# Patient Record
Sex: Female | Born: 2000 | Race: White | Hispanic: Yes | Marital: Single | Smoking: Never smoker
Health system: Southern US, Community
[De-identification: ages and names within clinical notes are randomized; demographics above are authoritative.]

## PROBLEM LIST (undated history)

## (undated) ENCOUNTER — Inpatient Hospital Stay (HOSPITAL_COMMUNITY): Payer: Self-pay

---
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
## 2021-04-06 LAB — OB RESULTS CONSOLE GC/CHLAMYDIA
Chlamydia: NEGATIVE
Neisseria Gonorrhea: NEGATIVE

## 2021-04-09 LAB — HEPATITIS C ANTIBODY: HCV Ab: NEGATIVE

## 2021-04-09 LAB — OB RESULTS CONSOLE RPR: RPR: NONREACTIVE

## 2021-04-09 LAB — OB RESULTS CONSOLE ABO/RH: RH Type: POSITIVE

## 2021-04-09 LAB — OB RESULTS CONSOLE HEPATITIS B SURFACE ANTIGEN: Hepatitis B Surface Ag: NEGATIVE

## 2021-04-09 LAB — OB RESULTS CONSOLE ANTIBODY SCREEN: Antibody Screen: NEGATIVE

## 2021-04-09 LAB — OB RESULTS CONSOLE RUBELLA ANTIBODY, IGM: Rubella: NON-IMMUNE/NOT IMMUNE

## 2021-04-09 LAB — OB RESULTS CONSOLE HIV ANTIBODY (ROUTINE TESTING): HIV: NONREACTIVE

## 2021-09-09 LAB — OB RESULTS CONSOLE GBS: GBS: NEGATIVE

## 2021-09-29 ENCOUNTER — Encounter (HOSPITAL_COMMUNITY): Payer: Self-pay | Admitting: Obstetrics and Gynecology

## 2021-09-29 NOTE — Patient Instructions (Signed)
Courtney [name]  09/29/2021   Your procedure is scheduled on:  09/30/2021  Arrive at 1030 at Entrance C on [address] at [facility]  and [employer]. You are invited to use the FREE valet parking or use the Visitor's parking deck.  Pick up the phone at the desk and dial [phone].  Call this number if you have problems the morning of surgery: [phone]  Remember:   Do not eat food:(After Midnight) Desps de medianoche.  Do not drink clear liquids: (4 Hours before arrival) 4 horas ante llegada.  Take these medicines the morning of surgery with A SIP OF WATER:  none   Do not wear jewelry, make-up or nail polish.  Do not wear lotions, powders, or perfumes. Do not wear deodorant.  Do not shave 48 hours prior to surgery.  Do not bring valuables to the hospital.  [facility] is not   responsible for any belongings or valuables brought to the hospital.  Contacts, dentures or bridgework may not be worn into surgery.  Leave suitcase in the car. After surgery it may be brought to your room.  For patients admitted to the hospital, checkout time is 11:00 AM the day of              discharge.      Please read over the following fact sheets that you were given:     Preparing for Surgery

## 2021-09-30 ENCOUNTER — Other Ambulatory Visit: Payer: Self-pay | Admitting: Obstetrics and Gynecology

## 2021-09-30 ENCOUNTER — Encounter (HOSPITAL_COMMUNITY)
Admission: RE | Disposition: A | Discharge status: Home / Self Care | Payer: Self-pay | Source: Home / Self Care | Attending: Obstetrics and Gynecology

## 2021-09-30 ENCOUNTER — Inpatient Hospital Stay (HOSPITAL_COMMUNITY): Payer: Medicaid Other | Admitting: Anesthesiology

## 2021-09-30 ENCOUNTER — Other Ambulatory Visit: Payer: Self-pay

## 2021-09-30 ENCOUNTER — Inpatient Hospital Stay (HOSPITAL_COMMUNITY)
Admission: RE | Admit: 2021-09-30 | Discharge: 2021-10-02 | DRG: 788 | Disposition: A | Discharge status: Home / Self Care | Payer: Medicaid Other | Attending: Obstetrics and Gynecology | Admitting: Obstetrics and Gynecology

## 2021-09-30 ENCOUNTER — Encounter (HOSPITAL_COMMUNITY): Payer: Self-pay | Admitting: Obstetrics and Gynecology

## 2021-09-30 DIAGNOSIS — O9902 Anemia complicating childbirth: Secondary | ICD-10-CM | POA: Diagnosis present

## 2021-09-30 DIAGNOSIS — O321XX0 Maternal care for breech presentation, not applicable or unspecified: Secondary | ICD-10-CM

## 2021-09-30 DIAGNOSIS — D509 Iron deficiency anemia, unspecified: Secondary | ICD-10-CM | POA: Diagnosis present

## 2021-09-30 DIAGNOSIS — Z3A39 39 weeks gestation of pregnancy: Secondary | ICD-10-CM

## 2021-09-30 DIAGNOSIS — Z98891 History of uterine scar from previous surgery: Principal | ICD-10-CM

## 2021-09-30 DIAGNOSIS — D649 Anemia, unspecified: Secondary | ICD-10-CM

## 2021-09-30 DIAGNOSIS — O321XX1 Maternal care for breech presentation, fetus 1: Secondary | ICD-10-CM | POA: Diagnosis present

## 2021-09-30 LAB — CBC
HCT: 30.1 % — ABNORMAL LOW (ref 36.0–46.0)
Hemoglobin: 9.7 g/dL — ABNORMAL LOW (ref 12.0–15.0)
MCH: 24.1 pg — ABNORMAL LOW (ref 26.0–34.0)
MCHC: 32.2 g/dL (ref 30.0–36.0)
MCV: 74.9 fL — ABNORMAL LOW (ref 80.0–100.0)
Platelets: 387 10*3/uL (ref 150–400)
RBC: 4.02 MIL/uL (ref 3.87–5.11)
RDW: 14.8 % (ref 11.5–15.5)
WBC: 9.8 10*3/uL (ref 4.0–10.5)
nRBC: 0 % (ref 0.0–0.2)

## 2021-09-30 LAB — RPR: RPR Ser Ql: NONREACTIVE

## 2021-09-30 LAB — TYPE AND SCREEN
ABO/RH(D): O POS
Antibody Screen: NEGATIVE

## 2021-09-30 SURGERY — Surgical Case
Anesthesia: Spinal

## 2021-09-30 MED ORDER — DIPHENHYDRAMINE HCL 25 MG PO CAPS: 25.0000 mg | ORAL_CAPSULE | Freq: Four times a day (QID) | ORAL | Status: DC | PRN

## 2021-09-30 MED ORDER — TRANEXAMIC ACID-NACL 1000-0.7 MG/100ML-% IV SOLN
INTRAVENOUS | Status: DC | PRN
  Administered 2021-09-30: 1000 mg via INTRAVENOUS

## 2021-09-30 MED ORDER — MENTHOL 3 MG MT LOZG: 1.0000 | LOZENGE | OROMUCOSAL | Status: DC | PRN

## 2021-09-30 MED ORDER — DIBUCAINE (PERIANAL) 1 % EX OINT: 1.0000 | TOPICAL_OINTMENT | CUTANEOUS | Status: DC | PRN

## 2021-09-30 MED ORDER — KETOROLAC TROMETHAMINE 30 MG/ML IJ SOLN: 30.0000 mg | Freq: Four times a day (QID) | INTRAMUSCULAR | Status: AC | PRN

## 2021-09-30 MED ORDER — SENNOSIDES-DOCUSATE SODIUM 8.6-50 MG PO TABS
2.0000 | ORAL_TABLET | Freq: Every day | ORAL | Status: DC
  Administered 2021-10-01 – 2021-10-02 (×2): 2 via ORAL
  Filled 2021-09-30 (×2): qty 2

## 2021-09-30 MED ORDER — NALOXONE HCL 0.4 MG/ML IJ SOLN: 0.4000 mg | INTRAMUSCULAR | Status: DC | PRN

## 2021-09-30 MED ORDER — IBUPROFEN 600 MG PO TABS
600.0000 mg | ORAL_TABLET | Freq: Four times a day (QID) | ORAL | Status: DC | PRN
  Administered 2021-10-01 – 2021-10-02 (×3): 600 mg via ORAL
  Filled 2021-09-30 (×3): qty 1

## 2021-09-30 MED ORDER — TETANUS-DIPHTH-ACELL PERTUSSIS 5-2.5-18.5 LF-MCG/0.5 IM SUSY: 0.5000 mL | PREFILLED_SYRINGE | Freq: Once | INTRAMUSCULAR | Status: DC

## 2021-09-30 MED ORDER — DEXAMETHASONE SODIUM PHOSPHATE 4 MG/ML IJ SOLN
INTRAMUSCULAR | Status: DC | PRN
  Administered 2021-09-30: 4 mg via INTRAVENOUS

## 2021-09-30 MED ORDER — PROMETHAZINE HCL 25 MG/ML IJ SOLN: 6.2500 mg | INTRAMUSCULAR | Status: DC | PRN

## 2021-09-30 MED ORDER — SODIUM CHLORIDE 0.9% FLUSH: 3.0000 mL | INTRAVENOUS | Status: DC | PRN

## 2021-09-30 MED ORDER — DIPHENHYDRAMINE HCL 25 MG PO CAPS: 25.0000 mg | ORAL_CAPSULE | ORAL | Status: DC | PRN

## 2021-09-30 MED ORDER — OXYTOCIN-SODIUM CHLORIDE 30-0.9 UT/500ML-% IV SOLN
INTRAVENOUS | Status: DC | PRN
  Administered 2021-09-30: 300 mL via INTRAVENOUS

## 2021-09-30 MED ORDER — MORPHINE SULFATE (PF) 0.5 MG/ML IJ SOLN
INTRAMUSCULAR | Status: DC | PRN
  Administered 2021-09-30: 150 ug via INTRATHECAL

## 2021-09-30 MED ORDER — KETOROLAC TROMETHAMINE 30 MG/ML IJ SOLN
30.0000 mg | Freq: Four times a day (QID) | INTRAMUSCULAR | Status: AC | PRN
  Administered 2021-09-30 – 2021-10-01 (×3): 30 mg via INTRAVENOUS
  Filled 2021-09-30 (×3): qty 1

## 2021-09-30 MED ORDER — OXYTOCIN-SODIUM CHLORIDE 30-0.9 UT/500ML-% IV SOLN
2.5000 [IU]/h | INTRAVENOUS | Status: AC
  Administered 2021-09-30: 2.5 [IU]/h via INTRAVENOUS
  Filled 2021-09-30: qty 500

## 2021-09-30 MED ORDER — CEFAZOLIN SODIUM-DEXTROSE 2-4 GM/100ML-% IV SOLN
INTRAVENOUS | Status: AC
  Filled 2021-09-30: qty 100

## 2021-09-30 MED ORDER — CEFAZOLIN SODIUM-DEXTROSE 2-4 GM/100ML-% IV SOLN
2.0000 g | INTRAVENOUS | Status: AC
  Administered 2021-09-30: 2 g via INTRAVENOUS

## 2021-09-30 MED ORDER — SIMETHICONE 80 MG PO CHEW: 80.0000 mg | CHEWABLE_TABLET | ORAL | Status: DC | PRN

## 2021-09-30 MED ORDER — FENTANYL CITRATE (PF) 100 MCG/2ML IJ SOLN
INTRAMUSCULAR | Status: AC
  Filled 2021-09-30: qty 2

## 2021-09-30 MED ORDER — MORPHINE SULFATE (PF) 0.5 MG/ML IJ SOLN
INTRAMUSCULAR | Status: AC
  Filled 2021-09-30: qty 10

## 2021-09-30 MED ORDER — PHENYLEPHRINE HCL-NACL 20-0.9 MG/250ML-% IV SOLN
INTRAVENOUS | Status: AC
  Filled 2021-09-30: qty 250

## 2021-09-30 MED ORDER — SOD CITRATE-CITRIC ACID 500-334 MG/5ML PO SOLN
30.0000 mL | ORAL | Status: AC
  Administered 2021-09-30: 30 mL via ORAL

## 2021-09-30 MED ORDER — NALOXONE HCL 4 MG/10ML IJ SOLN: 1.0000 ug/kg/h | INTRAVENOUS | Status: DC | PRN

## 2021-09-30 MED ORDER — PRENATAL MULTIVITAMIN CH
1.0000 | ORAL_TABLET | Freq: Every day | ORAL | Status: DC
  Administered 2021-10-01 – 2021-10-02 (×2): 1 via ORAL
  Filled 2021-09-30 (×2): qty 1

## 2021-09-30 MED ORDER — SCOPOLAMINE 1 MG/3DAYS TD PT72
MEDICATED_PATCH | TRANSDERMAL | Status: AC
  Filled 2021-09-30: qty 1

## 2021-09-30 MED ORDER — MEPERIDINE HCL 25 MG/ML IJ SOLN: 6.2500 mg | INTRAMUSCULAR | Status: DC | PRN

## 2021-09-30 MED ORDER — ONDANSETRON HCL 4 MG/2ML IJ SOLN
INTRAMUSCULAR | Status: AC
  Filled 2021-09-30: qty 2

## 2021-09-30 MED ORDER — ACETAMINOPHEN 10 MG/ML IV SOLN: 1000.0000 mg | Freq: Once | INTRAVENOUS | Status: DC | PRN

## 2021-09-30 MED ORDER — KETOROLAC TROMETHAMINE 30 MG/ML IJ SOLN
INTRAMUSCULAR | Status: AC
  Filled 2021-09-30: qty 1

## 2021-09-30 MED ORDER — ACETAMINOPHEN 10 MG/ML IV SOLN
INTRAVENOUS | Status: DC | PRN
  Administered 2021-09-30: 1000 mg via INTRAVENOUS

## 2021-09-30 MED ORDER — HYDROMORPHONE HCL 1 MG/ML IJ SOLN: 0.2500 mg | INTRAMUSCULAR | Status: DC | PRN

## 2021-09-30 MED ORDER — ONDANSETRON HCL 4 MG/2ML IJ SOLN: 4.0000 mg | Freq: Three times a day (TID) | INTRAMUSCULAR | Status: DC | PRN

## 2021-09-30 MED ORDER — PHENYLEPHRINE HCL-NACL 20-0.9 MG/250ML-% IV SOLN
INTRAVENOUS | Status: DC | PRN
  Administered 2021-09-30: 60 ug/min via INTRAVENOUS

## 2021-09-30 MED ORDER — OXYCODONE HCL 5 MG PO TABS: 5.0000 mg | ORAL_TABLET | Freq: Once | ORAL | Status: DC | PRN

## 2021-09-30 MED ORDER — ACETAMINOPHEN 10 MG/ML IV SOLN
INTRAVENOUS | Status: AC
  Filled 2021-09-30: qty 100

## 2021-09-30 MED ORDER — DEXAMETHASONE SODIUM PHOSPHATE 4 MG/ML IJ SOLN
INTRAMUSCULAR | Status: AC
  Filled 2021-09-30: qty 1

## 2021-09-30 MED ORDER — ZOLPIDEM TARTRATE 5 MG PO TABS: 5.0000 mg | ORAL_TABLET | Freq: Every evening | ORAL | Status: DC | PRN

## 2021-09-30 MED ORDER — SOD CITRATE-CITRIC ACID 500-334 MG/5ML PO SOLN
ORAL | Status: AC
  Filled 2021-09-30: qty 30

## 2021-09-30 MED ORDER — FENTANYL CITRATE (PF) 100 MCG/2ML IJ SOLN
INTRAMUSCULAR | Status: DC | PRN
  Administered 2021-09-30: 15 ug via INTRATHECAL

## 2021-09-30 MED ORDER — OXYCODONE HCL 5 MG PO TABS: 5.0000 mg | ORAL_TABLET | ORAL | Status: DC | PRN

## 2021-09-30 MED ORDER — SCOPOLAMINE 1 MG/3DAYS TD PT72
1.0000 | MEDICATED_PATCH | Freq: Once | TRANSDERMAL | Status: DC
  Administered 2021-09-30: 1.5 mg via TRANSDERMAL

## 2021-09-30 MED ORDER — TRANEXAMIC ACID-NACL 1000-0.7 MG/100ML-% IV SOLN
INTRAVENOUS | Status: AC
  Filled 2021-09-30: qty 100

## 2021-09-30 MED ORDER — COCONUT OIL OIL: 1.0000 | TOPICAL_OIL | Status: DC | PRN

## 2021-09-30 MED ORDER — DIPHENHYDRAMINE HCL 50 MG/ML IJ SOLN: 12.5000 mg | INTRAMUSCULAR | Status: DC | PRN

## 2021-09-30 MED ORDER — ONDANSETRON HCL 4 MG/2ML IJ SOLN
INTRAMUSCULAR | Status: DC | PRN
  Administered 2021-09-30: 4 mg via INTRAVENOUS

## 2021-09-30 MED ORDER — LACTATED RINGERS IV SOLN: INTRAVENOUS | Status: DC

## 2021-09-30 MED ORDER — ACETAMINOPHEN 325 MG PO TABS
650.0000 mg | ORAL_TABLET | ORAL | Status: DC | PRN
  Administered 2021-09-30 – 2021-10-01 (×5): 650 mg via ORAL
  Filled 2021-09-30 (×5): qty 2

## 2021-09-30 MED ORDER — WITCH HAZEL-GLYCERIN EX PADS: 1.0000 | MEDICATED_PAD | CUTANEOUS | Status: DC | PRN

## 2021-09-30 MED ORDER — OXYCODONE HCL 5 MG/5ML PO SOLN: 5.0000 mg | Freq: Once | ORAL | Status: DC | PRN

## 2021-09-30 MED ORDER — SIMETHICONE 80 MG PO CHEW
80.0000 mg | CHEWABLE_TABLET | Freq: Three times a day (TID) | ORAL | Status: DC
  Administered 2021-09-30 – 2021-10-02 (×4): 80 mg via ORAL
  Filled 2021-09-30 (×4): qty 1

## 2021-09-30 SURGICAL SUPPLY — 36 items
BENZOIN TINCTURE PRP APPL 2/3 (GAUZE/BANDAGES/DRESSINGS) ×1 IMPLANT
CHLORAPREP W/TINT 26ML (MISCELLANEOUS) ×2 IMPLANT
CLAMP CORD UMBIL (MISCELLANEOUS) ×1 IMPLANT
CLOTH BEACON ORANGE TIMEOUT ST (SAFETY) ×1 IMPLANT
DRAIN JACKSON PRT FLT 10 (DRAIN) IMPLANT
DRSG OPSITE POSTOP 4X10 (GAUZE/BANDAGES/DRESSINGS) ×1 IMPLANT
ELECT REM PT RETURN 9FT ADLT (ELECTROSURGICAL) ×1
ELECTRODE REM PT RTRN 9FT ADLT (ELECTROSURGICAL) ×1 IMPLANT
EVACUATOR SILICONE 100CC (DRAIN) IMPLANT
EXTRACTOR VACUUM M CUP 4 TUBE (SUCTIONS) IMPLANT
GAUZE SPONGE 4X4 12PLY STRL LF (GAUZE/BANDAGES/DRESSINGS) IMPLANT
GLOVE BIO SURGEON STRL SZ 6.5 (GLOVE) ×1 IMPLANT
GLOVE BIOGEL PI IND STRL 7.0 (GLOVE) ×2 IMPLANT
GOWN STRL REUS W/TWL LRG LVL3 (GOWN DISPOSABLE) ×2 IMPLANT
KIT ABG SYR 3ML LUER SLIP (SYRINGE) IMPLANT
NDL HYPO 25X5/8 SAFETYGLIDE (NEEDLE) IMPLANT
NEEDLE HYPO 25X5/8 SAFETYGLIDE (NEEDLE) IMPLANT
NS IRRIG 1000ML POUR BTL (IV SOLUTION) ×1 IMPLANT
PACK C SECTION WH (CUSTOM PROCEDURE TRAY) ×1 IMPLANT
PAD ABD 7.5X8 STRL (GAUZE/BANDAGES/DRESSINGS) IMPLANT
PAD OB MATERNITY 4.3X12.25 (PERSONAL CARE ITEMS) ×1 IMPLANT
RTRCTR C-SECT PINK 25CM LRG (MISCELLANEOUS) IMPLANT
STRIP CLOSURE SKIN 1/2X4 (GAUZE/BANDAGES/DRESSINGS) ×1 IMPLANT
SUT CHROMIC 0 CT 1 (SUTURE) ×1 IMPLANT
SUT MNCRL AB 3-0 PS2 27 (SUTURE) ×1 IMPLANT
SUT PLAIN 2 0 (SUTURE) ×3
SUT PLAIN 2 0 XLH (SUTURE) ×1 IMPLANT
SUT PLAIN ABS 2-0 CT1 27XMFL (SUTURE) ×2 IMPLANT
SUT SILK 2 0 SH (SUTURE) IMPLANT
SUT VIC AB 0 CTX 36 (SUTURE) ×5
SUT VIC AB 0 CTX36XBRD ANBCTRL (SUTURE) ×4 IMPLANT
SUT VIC AB 2-0 SH 27 (SUTURE)
SUT VIC AB 2-0 SH 27XBRD (SUTURE) IMPLANT
TOWEL OR 17X24 6PK STRL BLUE (TOWEL DISPOSABLE) ×1 IMPLANT
TRAY FOLEY W/BAG SLVR 14FR LF (SET/KITS/TRAYS/PACK) ×1 IMPLANT
WATER STERILE IRR 1000ML POUR (IV SOLUTION) ×1 IMPLANT

## 2021-09-30 NOTE — Lactation Note (Signed)
This note was copied from a baby's chart. Lactation Consultation Note  Patient Name: Girl Dominick Zertuche [id number] Date: 09/30/2021 Reason for consult: Initial assessment;Primapara;Term Age:21 hours Mom stated BF going well. Assisted baby to the breast. Mom is compressible and can latch well. Denies painful latch. LC gave shells for am and hand pump for pre-pumping. Hand expression taught w/easily expressed colostrum. Mom stated she has been leaking since 19 weeks gest. Hand expressed colostrum in spoon to stimulate baby to BF. It worked well. Mom needed 21# flange. Newborn feeding habits, behavior, STS, I&O reviewed. Mom encouraged to feed baby 8-12 times/24 hours and with feeding cues.   Encouraged to call for questions or concerns.  Maternal Data Has patient been taught Hand Expression?: Yes Does the patient have breastfeeding experience prior to this delivery?: No  Feeding    LATCH Score Latch: Grasps breast easily, tongue down, lips flanged, rhythmical sucking.  Audible Swallowing: A few with stimulation  Type of Nipple: Flat  Comfort (Breast/Nipple): Soft / non-tender  Hold (Positioning): Assistance needed to correctly position infant at breast and maintain latch.  LATCH Score: 7   Lactation Tools Discussed/Used Tools: Shells;Pump;Flanges Flange Size: 21 Breast pump type: Manual Pump Education: Setup, frequency, and cleaning;Milk Storage Reason for Pumping: pre-pumping Pumping frequency: pre-pump  Interventions Interventions: Breast feeding basics reviewed;Adjust position;Assisted with latch;Support pillows;Skin to skin;Position options;Breast massage;Expressed milk;Hand express;LC Services brochure;Pre-pump if needed;Shells;Breast compression  Discharge    Consult Status Consult Status: Follow-up Date: 10/01/21 Follow-up type: In-patient    Charyl Dancer 09/30/2021, 11:28 PM

## 2021-09-30 NOTE — H&P (Signed)
Courtney [name] is a 21 y.o. female presenting for cesarean due to breeech presentation. OB History     Gravida  1   Para      Term      Preterm      AB      Living         SAB      IAB      Ectopic      Multiple      Live Births             No past medical history on file. No past surgical history on file. Family History: family history is not on file. Social History:  reports that she has never smoked. She has never used smokeless tobacco. She reports current alcohol use. She reports that she does not use drugs.     Maternal Diabetes: No Genetic Screening: Normal Maternal Ultrasounds/Referrals: Normal Fetal Ultrasounds or other Referrals:  None Maternal Substance Abuse:  No Significant Maternal Medications:  None Significant Maternal Lab Results:  Group B Strep negative Number of Prenatal Visits:greater than 3 verified prenatal visits Other Comments:  None  Review of Systems History   Height 5\' 4"  (1.626 m), weight 77.6 kg. Exam Physical Exam  Physical Examination: General appearance - alert, well appearing, and in no distress Chest - clear to auscultation, no wheezes, rales or rhonchi, symmetric air entry Heart - normal rate and regular rhythm Abdomen - soft, nontender, nondistended, no masses or organomegaly gravid Extremities - peripheral pulses normal, no pedal edema, no clubbing or cyanosis, Homan's sign negative bilaterally  Prenatal labs: ABO, Rh: O/Positive/-- (03/23 0000) Antibody: Negative (03/23 0000) Rubella: Nonimmune (03/23 0000) RPR: Nonreactive (03/23 0000)  HBsAg: Negative (03/23 0000)  HIV: Non-reactive (03/23 0000)  GBS: Negative/-- (08/23 0000)   Assessment/Plan: TERM BREECH PRESENTATION PT DECLINED VERSION SHE UNDERSTANDS THE RISKS ARE BLEEDING,INFECTION AND DAMAGE TO INTERNAL ORGANS SUCH AS BOWEL AND BLADDER.     Lorae Roig A Wessie Shanks 09/30/2021, 9:02 AM

## 2021-09-30 NOTE — Anesthesia Preprocedure Evaluation (Signed)
Anesthesia Evaluation  Patient identified by MRN, date of birth, ID band Patient awake    Reviewed: Allergy & Precautions, H&P , NPO status , Patient's Chart, lab work & pertinent test results  History of Anesthesia Complications Negative for: history of anesthetic complications  Airway Mallampati: II  TM Distance: >3 FB     Dental   Pulmonary neg pulmonary ROS,    Pulmonary exam normal        Cardiovascular negative cardio ROS   Rhythm:regular Rate:Normal     Neuro/Psych negative neurological ROS  negative psych ROS   GI/Hepatic negative GI ROS, Neg liver ROS,   Endo/Other  negative endocrine ROS  Renal/GU negative Renal ROS  negative genitourinary   Musculoskeletal   Abdominal   Peds  Hematology  (+) Blood dyscrasia, anemia ,   Anesthesia Other Findings   Reproductive/Obstetrics (+) Pregnancy G1P0 at [redacted]w[redacted]d                             Anesthesia Physical Anesthesia Plan  ASA: 2  Anesthesia Plan: Spinal   Post-op Pain Management:    Induction:   PONV Risk Score and Plan: Ondansetron and Treatment may vary due to age or medical condition  Airway Management Planned:   Additional Equipment:   Intra-op Plan:   Post-operative Plan:   Informed Consent: I have reviewed the patients History and Physical, chart, labs and discussed the procedure including the risks, benefits and alternatives for the proposed anesthesia with the patient or authorized representative who has indicated his/her understanding and acceptance.       Plan Discussed with: Anesthesiologist  Anesthesia Plan Comments:         Anesthesia Quick Evaluation

## 2021-09-30 NOTE — Op Note (Signed)
Cesarean Section Procedure Note   Courtney [name]  09/30/2021  Indications: Breech Presentation   Pre-operative Diagnosis: BREECH.   Post-operative Diagnosis: Same   Surgeon: Surgeon(s) and Role:    * Jaymes Graff, MD - Primary    * Osborn Coho, MD   Assistants: Dr Su Hilt   Anesthesia: spinal   Procedure Details:  The patient was seen in the Holding Room. The risks, benefits, complications, treatment options, and expected outcomes were discussed with the patient. The patient concurred with the proposed plan, giving informed consent. identified as [name] and the procedure verified as C-Section Delivery. A Time Out was held and the above information confirmed.  After induction of anesthesia, the patient was draped and prepped in the usual sterile manner. A transverse incision was made and carried down through the subcutaneous tissue to the fascia. Fascial incision was made in the midline and extended transversely. The fascia was separated from the underlying rectus muscle superiorly and inferiorly. The peritoneum was identified and entered. Peritoneal incision was extended longitudinally with good visualization of bowel and bladder. The utero-vesical peritoneal reflection was incised transversely and the bladder flap was bluntly freed from the lower uterine segment.  An alexsis retractor was placed in the abdomen.   A low transverse uterine incision was made. Delivered from breech frank presentation was a  infant, with Apgar scores of 9 at one minute and 9 at five minutes. Cord ph was not sent the umbilical cord was clamped and cut cord blood was obtained for evaluation. The placenta was removed Intact and appeared normal. The uterine outline, tubes and ovaries appeared normal}. The uterine incision was closed with running locked sutures of 0Vicryl. A second layer 0 vicrlyl was used to imbricate the uterine incision .  Several figure 8 vicryl was used to obtain hemostasis    Hemostasis was observed. Lavage was carried out until clear. The alexsis was removed.  The peritoneum was closed with 0 chromic.  The muscles were examined and any bleeders were made hemostatic using bovie cautery device.   The fascia was then reapproximated with running sutures of 0 vicryl.  The subcutaneous tissue was reapproximated  With interrupted stitches using 2-0 plain gut. The subcuticular closure was performed using 3-29monocryl     Instrument, sponge, and needle counts were correct prior the abdominal closure and were correct at the conclusion of the case.    Findings: infant was delivered from frank breech presentation. The fluid was clear.  The uterus tubes and ovaries appeared normal.     Estimated Blood Loss: 618cc   Total IV Fluids:   Urine Output:  200CC OF clear urine  Specimens: none  Complications: no complications  Disposition: PACU - hemodynamically stable.   Maternal Condition: stable   Baby condition / location:  Couplet care / Skin to Skin  Attending Attestation: I was present and scrubbed for the entire procedure.   Signed: Surgeon(s): Osborn Coho, MD Jaymes Graff, MD

## 2021-09-30 NOTE — Anesthesia Postprocedure Evaluation (Signed)
Anesthesia Post Note  Patient: Courtney [name]  Procedure(s) Performed: CESAREAN SECTION     Patient location during evaluation: PACU Anesthesia Type: Spinal Level of consciousness: oriented and awake and alert Pain management: pain level controlled Vital Signs Assessment: post-procedure vital signs reviewed and stable Respiratory status: spontaneous breathing, respiratory function stable and nonlabored ventilation Cardiovascular status: blood pressure returned to baseline and stable Postop Assessment: no headache, no backache, no apparent nausea or vomiting and spinal receding Anesthetic complications: no   No notable events documented.  Last Vitals:  Vitals:   09/30/21 1615 09/30/21 1719  BP: 105/72 115/75  Pulse: 77 70  Resp: 16 20  Temp: 36.5 C   SpO2: 99% 100%    Last Pain:  Vitals:   09/30/21 2012  TempSrc:   PainSc: 2                  Lucretia Kern

## 2021-09-30 NOTE — Transfer of Care (Signed)
Immediate Anesthesia Transfer of Care Note  Patient: Courtney [name]  Procedure(s) Performed: CESAREAN SECTION  Patient Location: PACU  Anesthesia Type:Spinal  Level of Consciousness: awake, alert  and oriented  Airway & Oxygen Therapy: Patient Spontanous Breathing  Post-op Assessment: Report given to RN and Post -op Vital signs reviewed and stable  Post vital signs: Reviewed and stable BP 96/57  Last Vitals:  Vitals Value Taken Time  BP    Temp    Pulse 79 09/30/21 1501  Resp 13 09/30/21 1501  SpO2 98 % 09/30/21 1501  Vitals shown include unvalidated device data.  Last Pain:  Vitals:   09/30/21 1059  TempSrc: Oral  PainSc: 7          Complications: No notable events documented.

## 2021-10-01 LAB — CBC
HCT: 23.5 % — ABNORMAL LOW (ref 36.0–46.0)
Hemoglobin: 7.7 g/dL — ABNORMAL LOW (ref 12.0–15.0)
MCH: 24.7 pg — ABNORMAL LOW (ref 26.0–34.0)
MCHC: 32.8 g/dL (ref 30.0–36.0)
MCV: 75.3 fL — ABNORMAL LOW (ref 80.0–100.0)
Platelets: 287 10*3/uL (ref 150–400)
RBC: 3.12 MIL/uL — ABNORMAL LOW (ref 3.87–5.11)
RDW: 14.9 % (ref 11.5–15.5)
WBC: 13.7 10*3/uL — ABNORMAL HIGH (ref 4.0–10.5)
nRBC: 0 % (ref 0.0–0.2)

## 2021-10-01 NOTE — Lactation Note (Signed)
This note was copied from a baby's chart. Lactation Consultation Note  Patient Name: Courtney [name] [id number] Date: 10/01/2021 Reason for consult: Follow-up assessment;Term;Primapara Age:21 hours  Parent holding infant; Baby was swaddled and dressed.Parent states baby fell asleep.  LC suggested feeding STS.   Redness and small abrasion noted on right nipple.    Baby undressed and began cueing, sucking hands.   LC placed baby STS, pillow support used.   Baby latched, waking techniques taught.  A few swallows heard.  Parents encouraged to keep baby STS, esp. With feedings, feed with cues, 8-12 times daily. [name] pump reviewed along with storage information.    Encouraged to call out for assistance if needed.   Maternal Data Has patient been taught [name] Expression?: Yes  Feeding Mother's Current Feeding Choice: Breast Milk  LATCH Score Latch: Grasps breast easily, tongue down, lips flanged, rhythmical sucking.  Audible Swallowing: A few with stimulation  Type of Nipple: Everted at rest and after stimulation  Comfort (Breast/Nipple): Filling, red/small blisters or bruises, mild/mod discomfort  Hold (Positioning): Assistance needed to correctly position infant at breast and maintain latch.  LATCH Score: 7   Lactation Tools Discussed/Used Tools: Pump Breast pump type: Manual Reason for Pumping: pre pumping  Interventions Interventions: Breast feeding basics reviewed;Assisted with latch;Skin to skin;Education;LC Services brochure  Discharge Pump: Manual  Consult Status Consult Status: Follow-up Date: 10/02/21 Follow-up type: In-patient    Maryruth Hancock [facility] 10/01/2021, 3:04 PM

## 2021-10-01 NOTE — Social Work (Signed)
CSW received consult due to score 10 on Edinburgh Depression Screen. CSW met with MOB to complete an assessment and provide resources. CSW entered the room introduced self, CSW role and reason for visit. MOB was agreeable to visit. MOB was agreeable to visit. CSW observed MOB holding the infant. CSW inquired about how MOB was feeling, MOB reported she was pretty good just tired, MOB reported her delivery process was fast and uncomplicated. CSW inquired about MOB's mood in the past 7 days. MOB reported she was nervous about the delivery and motherhood. CSW validated MOB's feelings. MOB reported no MH diagnosis, or treatment. CSW assessed for safety, MOB denied any SI or HI. MOB reported her supports are her boyfriend (FOB) and her mom. CSW provided education regarding Baby Blues vs PMADs and provided MOB with resources for mental health follow up.  CSW encouraged MOB to evaluate her mental health throughout the postpartum period with the use of the New Mom Checklist developed by [facility] as well as the Edinburgh Postnatal Depression Scale and notify a medical professional if symptoms arise.   MOB was agreeable to contacting her OB if needs arise.   CSW provided review of Sudden Infant Death Syndrome (SIDS) precautions. MOB reported she has all necessary items for the infant including a bassinet for her to sleep.  CSW identifies no further need for intervention and no barriers to discharge at this time.   Kelsie Bowser, LCSWA Clinical Social Worker [phone]  

## 2021-10-01 NOTE — Progress Notes (Signed)
Subjective: Postpartum Day 1: Cesarean Delivery Patient reports tolerating PO and no problems voiding.  Denies any N/V/light headedness.  Ambulating without difficulty.  Objective: Vital signs in last 24 hours: Temp:  [97.5 F (36.4 C)-98.2 F (36.8 C)] 98.2 F (36.8 C) (09/14 0533) Pulse Rate:  [57-91] 72 (09/14 0533) Resp:  [13-23] 16 (09/14 0533) BP: (96-124)/(57-84) 110/72 (09/14 0533) SpO2:  [98 %-100 %] 99 % (09/14 0533)  Physical Exam:  General: alert and no distress Lochia: appropriate Uterine Fundus: firm, NT Incision: minimal staining outlined on dressing, otherwise intact and no active bleeding DVT Evaluation: no calf tenderness  Recent Labs    09/30/21 1058 10/01/21 0509  HGB 9.7* 7.7*  HCT 30.1* 23.5*    Assessment/Plan: Status post Cesarean section. Doing well postoperatively.  Continue current care. SCDs for DVT prophylaxis  Purcell Nails, MD 10/01/2021, 12:16 PM

## 2021-10-02 DIAGNOSIS — D509 Iron deficiency anemia, unspecified: Secondary | ICD-10-CM | POA: Diagnosis present

## 2021-10-02 MED ORDER — POLYSACCHARIDE IRON COMPLEX 150 MG PO CAPS
150.0000 mg | ORAL_CAPSULE | Freq: Every day | ORAL | Status: DC
  Administered 2021-10-02: 150 mg via ORAL
  Filled 2021-10-02: qty 1

## 2021-10-02 MED ORDER — SODIUM CHLORIDE 0.9 % IV SOLN
500.0000 mg | Freq: Once | INTRAVENOUS | Status: AC
  Administered 2021-10-02: 500 mg via INTRAVENOUS
  Filled 2021-10-02: qty 500

## 2021-10-02 MED ORDER — POLYSACCHARIDE IRON COMPLEX 150 MG PO CAPS: 150.0000 mg | ORAL_CAPSULE | Freq: Every day | ORAL | 2 refills | Status: AC

## 2021-10-02 MED ORDER — ACETAMINOPHEN 325 MG PO TABS: 650.0000 mg | ORAL_TABLET | Freq: Four times a day (QID) | ORAL | Status: AC | PRN

## 2021-10-02 MED ORDER — MAGNESIUM OXIDE -MG SUPPLEMENT 400 (240 MG) MG PO TABS
400.0000 mg | ORAL_TABLET | Freq: Two times a day (BID) | ORAL | Status: DC
  Administered 2021-10-02: 400 mg via ORAL
  Filled 2021-10-02: qty 1

## 2021-10-02 MED ORDER — MAGNESIUM OXIDE -MG SUPPLEMENT 400 (240 MG) MG PO TABS: 400.0000 mg | ORAL_TABLET | Freq: Two times a day (BID) | ORAL | Status: AC

## 2021-10-02 MED ORDER — OXYCODONE HCL 5 MG PO TABS: 5.0000 mg | ORAL_TABLET | Freq: Four times a day (QID) | ORAL | 0 refills | Status: AC | PRN

## 2021-10-02 MED ORDER — IBUPROFEN 600 MG PO TABS: 600.0000 mg | ORAL_TABLET | Freq: Four times a day (QID) | ORAL | 0 refills | Status: DC | PRN

## 2021-10-02 NOTE — Discharge Summary (Signed)
Postpartum Discharge Summary  Date of Service updated 10/02/21     Patient Name: Courtney [name] DOB: [birth year] MRN: [id number]  Date of admission: 09/30/2021 Delivery date:09/30/2021  Delivering provider: Crawford Givens  Date of discharge: 10/02/2021  Admitting diagnosis: Breech birth, fetus 35 [O32.1XX1] S/P cesarean section [Z98.891] Intrauterine pregnancy: [redacted]w[redacted]d    Secondary diagnosis:  Principal Problem:   Breech birth, fetus 1 Active Problems:   S/P cesarean section   IDA (iron deficiency anemia)  Additional problems: none    Discharge diagnosis: Term Pregnancy Delivered and Anemia                                              Post partum procedures: IV Venofer Augmentation: N/A Complications: None  Hospital course: Scheduled C/S   21y.o. yo G1P1001 at 361w6das admitted to the hospital 09/30/2021 for scheduled cesarean section with the following indication: Breech presentation.Delivery details are as follows:  Membrane Rupture Time/Date: 2:08 PM ,09/30/2021   Delivery Method:C-Section, Low Transverse  Details of operation can be found in separate operative note.  Patient had an uncomplicated postpartum course.  She is ambulating, tolerating a regular diet, passing flatus, and urinating well. Patient is discharged home in stable condition on  10/02/21        Newborn Data: Birth date:09/30/2021  Birth time:2:09 PM  Gender:Female  Living status:Living  Apgars:9 ,9  Weight:2970 g     Magnesium Sulfate received: No BMZ received: No Rhophylac:N/A MMR:N/A Transfusion:No  Physical exam  Vitals:   10/01/21 0533 10/01/21 1447 10/01/21 2345 10/02/21 0530  BP: 110/72 100/68 112/68 110/73  Pulse: 72 80 78 82  Resp: '16 18 18 18  ' Temp: 98.2 F (36.8 C) 97.8 F (36.6 C) 97.9 F (36.6 C) 97.9 F (36.6 C)  TempSrc: Oral Axillary Oral Oral  SpO2: 99% 100% 100%   Weight:      Height:       General: alert, cooperative, and no distress Lochia: appropriate Uterine  Fundus: firm Incision: Dressing changed, clean, dry, intact DVT Evaluation: No evidence of DVT seen on physical exam. No cords or calf tenderness. No significant calf/ankle edema. Labs: Lab Results  Component Value Date   WBC 13.7 (H) 10/01/2021   HGB 7.7 (L) 10/01/2021   HCT 23.5 (L) 10/01/2021   MCV 75.3 (L) 10/01/2021   PLT 287 10/01/2021       No data to display         Edinburgh Score:    09/30/2021    8:12 PM  Edinburgh Postnatal Depression Scale Screening Tool  I have been able to laugh and see the funny side of things. 0  I have looked forward with enjoyment to things. 0  I have blamed myself unnecessarily when things went wrong. 2  I have been anxious or worried for no good reason. 2  I have felt scared or panicky for no good reason. 1  Things have been getting on top of me. 1  I have been so unhappy that I have had difficulty sleeping. 1  I have felt sad or miserable. 2  I have been so unhappy that I have been crying. 1  The thought of harming myself has occurred to me. 0  Edinburgh Postnatal Depression Scale Total 10      After visit meds:  Allergies as of 10/02/2021  No Known Allergies      Medication List     TAKE these medications    acetaminophen 325 MG tablet Commonly known as: TYLENOL Take 2 tablets (650 mg total) by mouth every 6 (six) hours as needed for mild pain (temperature > 101.5.).   ibuprofen 600 MG tablet Commonly known as: ADVIL Take 1 tablet (600 mg total) by mouth every 6 (six) hours as needed for mild pain or cramping.   iron polysaccharides 150 MG capsule Commonly known as: NIFEREX Take 1 capsule (150 mg total) by mouth daily. Start taking on: October 03, 2021   magnesium oxide 400 (240 Mg) MG tablet Commonly known as: MAG-OX Take 1 tablet (400 mg total) by mouth 2 (two) times daily.   oxyCODONE 5 MG immediate release tablet Commonly known as: Oxy IR/ROXICODONE Take 1 tablet (5 mg total) by mouth every 6 (six)  hours as needed for up to 5 days for severe pain or breakthrough pain.         Discharge home in stable condition Infant Feeding: Breast Infant Disposition:home with mother Discharge instruction: per After Visit Summary and Postpartum booklet. Activity: Advance as tolerated. Pelvic rest for 6 weeks.  Diet: routine diet Anticipated Birth Control: Unsure and aware of options Postpartum Appointment:6 weeks Additional Postpartum F/U:  none Future Appointments:No future appointments. Follow up Visit:  Follow-up Information     Dillard, Naima, MD. Go in 6 week(s).   Specialty: Obstetrics and Gynecology Contact information: [address] [place] [address] [phone]                     10/02/2021 Arrie Eastern, CNM

## 2021-10-02 NOTE — Lactation Note (Signed)
This note was copied from a baby's chart. Lactation Consultation Note  Patient Name: Courtney [name] [id number] Date: 10/02/2021 Reason for consult: Follow-up assessment;Primapara;1st time breastfeeding;Term;Infant < 6lbs;Infant weight loss (9% weight loss) Age:21 hours  Visited with family of 21 hours old FT female, Ms. Courtney [name] is a P1 and reports breastfeeding is going well, she's been taking baby "Marin Comment" to breast consistently on feeding cues and also working on hand expression (she gets drops), but voiced that her R nipple was getting sore. Showed her the sandwich hold technique for a deep latch, baby nursed for 9 minutes before falling asleep (see LATCH score), per mom baby had already nursed prior [facility] arrival for another 15 minutes. Since baby is at 9% set up a DEBP and also provided information regarding supplementation choices in case weight loss does not improve, family aware of donor milk and infant formula availability. Reviewed normal newborn behavior, feeding cues, size of baby's stomach, pumping schedule and infant feeding. Courtney [name] has also a hand pump to take home after discharge, she won't be going home today but tomorrow.   Feeding Mother's Current Feeding Choice: Breast Milk  LATCH Score Latch: Grasps breast easily, tongue down, lips flanged, rhythmical sucking.  Audible Swallowing: A few with stimulation (with compressions)  Type of Nipple: Everted at rest and after stimulation  Comfort (Breast/Nipple): Soft / non-tender (R nipple was sore, but L (where baby latched was intact))  Hold (Positioning): Assistance needed to correctly position infant at breast and maintain latch.  LATCH Score: 8  Lactation Tools Discussed/Used Tools: Pump;Flanges;Coconut oil Flange Size: 24 (resized to # 24 today, nipples were larger) Breast pump type: Double-Electric Breast Pump;Manual Pump Education: Setup, frequency, and cleaning;Milk Storage Reason for Pumping: induction of  lactation Pumping frequency: q 3 hours or after feedings at the breast Pumped volume:  (drops)  Interventions Interventions: Breast feeding basics reviewed;Skin to skin;Breast massage;Hand express;Breast compression;Adjust position;Support pillows;Hand pump;DEBP;Education  Plan of care Encouraged to continue putting baby to breast on feeding cues 8-12 times/24 hours She'll start bilateral pumping every 3 hours or after feedings at the breast She'll start supplementing baby with any amount of EBM she might get with hand expression and/or pumping  FOB and aunt present and supportive. All questions and concerns answered, family to contact [facility] services PRN.  Consult Status Consult Status: Follow-up Date: 10/03/21 Follow-up type: In-patient   Courtney [name] Courtney [name] 10/02/2021, 12:19 PM

## 2021-10-02 NOTE — Lactation Note (Signed)
This note was copied from a baby's chart. Lactation Consultation Note  Patient Name: Courtney [name] [id number] Date: 10/02/2021   Age:21 hours Per RN Randel Books), Birth Parent declined [facility] services tonight.  Maternal Data    Feeding    LATCH Score                    Lactation Tools Discussed/Used    Interventions    Discharge    Consult Status      Courtney [name] 10/02/2021, 12:06 AM

## 2021-10-02 NOTE — Progress Notes (Signed)
Subjective: POD# 2 Information for the patient's newborn:  Courtney [name], Courtney [name] [[id number]]  female   Reports feeling tired Feeding: breast Reports tolerating PO and denies N/V, foley removed, ambulating and urinating w/o difficulty  Pain controlled with  PO meds Denies HA/SOB/dizziness  Flatus passing Vaginal bleeding is normal, no clots     Objective:  VS:  Vitals:   10/01/21 0533 10/01/21 1447 10/01/21 2345 10/02/21 0530  BP: 110/72 100/68 112/68 110/73  Pulse: 72 80 78 82  Resp: 16 18 18 18   Temp: 98.2 F (36.8 C) 97.8 F (36.6 C) 97.9 F (36.6 C) 97.9 F (36.6 C)  TempSrc: Oral Axillary Oral Oral  SpO2: 99% 100% 100%   Weight:      Height:        Intake/Output Summary (Last 24 hours) at 10/02/2021 0834 Last data filed at 10/01/2021 1015 Gross per 24 hour  Intake --  Output 400 ml  Net -400 ml     Recent Labs    09/30/21 1058 10/01/21 0509  WBC 9.8 13.7*  HGB 9.7* 7.7*  HCT 30.1* 23.5*  PLT 387 287    Blood type: --/--/O POS (09/13 1108) Rubella: Nonimmune (03/23 0000)    Physical Exam:  General: alert, cooperative, and no distress CV: no edema Resp: unlabored Abdomen: soft, nontender, normal bowel sounds Incision: serous and dressing changed drainage present Perineum:  Uterine Fundus: firm, below umbilicus, nontender Lochia:  appropriate Ext:  neg for edema, pain, tenderness, and cords   Assessment/Plan: 20 y.o.   POD# 2. G1P1001                  Principal Problem:   Breech birth, fetus 1 Active Problems:   S/P cesarean section   IDA (iron deficiency anemia)  Routine post-op PP care          IV venofer Start PO iron Breastfeeding support Anticipate D/C later today Plan reviewed with Dr. 36, DNP, CNM 10/02/2021, 8:34 AM

## 2021-10-03 ENCOUNTER — Ambulatory Visit: Payer: Self-pay

## 2021-10-03 NOTE — Lactation Note (Signed)
This note was copied from a baby's chart. Lactation Consultation Note  Patient Name: Courtney [name] NBVAP'O Date: 10/03/2021   Age:21 hours   [place] visit attempted, but curtain was drawn & room was dark & quiet. LC to f/u later.    Matthias Hughs [facility] 10/03/2021, 8:23 AM

## 2021-10-03 NOTE — Lactation Note (Signed)
This note was copied from a baby's chart. Lactation Consultation Note  Patient Name: Courtney [name] [id number] Date: 10/03/2021 Reason for consult: Follow-up assessment;Mother's request;Difficult latch;Primapara;1st time breastfeeding;Term;Infant weight loss;Breastfeeding assistance (9.23% WL) Age:21 hours  P1, Term, Infant Female, 9.29% WL  LC entered the room and the infant was being held by the supporting parent.  The birth parent stated that she is feeling much better about breastfeeding.  Per the birth parent, she has been able to pump more, so she feels a lot more confident in her ability to breastfeed.  The birth parent was pumping when the [facility] was in the room. LC reviewed milk storage guidelines, pumping frequency, and outpatient services.  LC also spoke with the birth parent about engorgement, warning signs, infant I/O, and outpatient services.  The birth parent stated that she had no further questions or concerns.   Current Feeding Plan:  Breastfeed the infant 8+ times in 24 hours according to feeding cues.  Pump and feed expressed milk to the infant after breastfeeding.  Watch infant I/O and call the pediatrician with concerns.  Call outpatient [place] with breastfeeding questions or concerns.   Maternal Data    Feeding Nipple Type: Extra Slow Flow  LATCH Score                    Lactation Tools Discussed/Used Pumped volume: 36 mL  Interventions Interventions: Breast feeding basics reviewed;Education;LC Services brochure  Discharge Discharge Education: Engorgement and breast care;Warning signs for feeding baby;Outpatient recommendation Pump: Personal;Hands Free (Per birth parent, pump will be delivered to their home tomorrow.)  Consult Status Consult Status: Complete Date: 10/03/21 Follow-up type: In-patient    Lysbeth Penner 10/03/2021, 1:53 PM

## 2021-10-07 ENCOUNTER — Telehealth (HOSPITAL_COMMUNITY): Payer: Self-pay | Admitting: *Deleted

## 2021-10-07 NOTE — Telephone Encounter (Signed)
Patient voiced no questions or concerns regarding her health at this time. EPDS=8. Patient voiced no questions or concerns regarding infant at this time. Patient reports infant sleeps in a bassinet on her back. RN reviewed ABCs of safe sleep. Patient verbalized understanding. Patient informed about hospital's virtual postpartum classes and support groups. Declined email information at this time. Erline Levine, [id number], 10/07/21,

## 2022-02-18 IMAGING — DX DG KNEE COMPLETE 4+V*R*
4 series · 4 of 4 positions shown · non-contrast
Comparison: None.

CLINICAL DATA: Walked out of a bar when patient noted she was
bleeding from both legs. Multiple penetrating wounds including 2 on
the right knee.

EXAM:
RIGHT KNEE - COMPLETE 4+ VIEW

[knee ap]
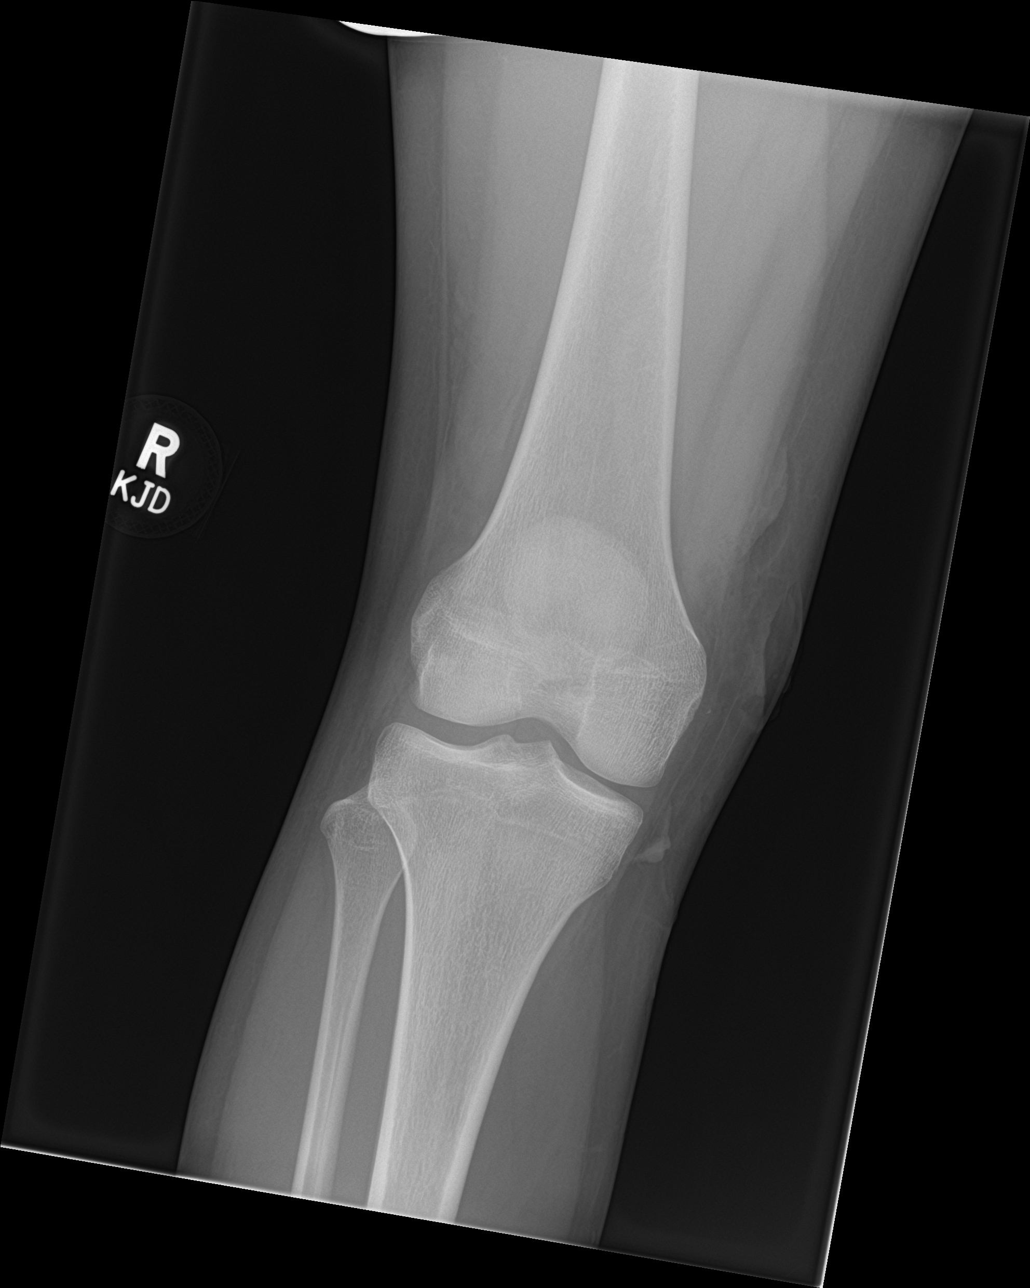

[knee lat]
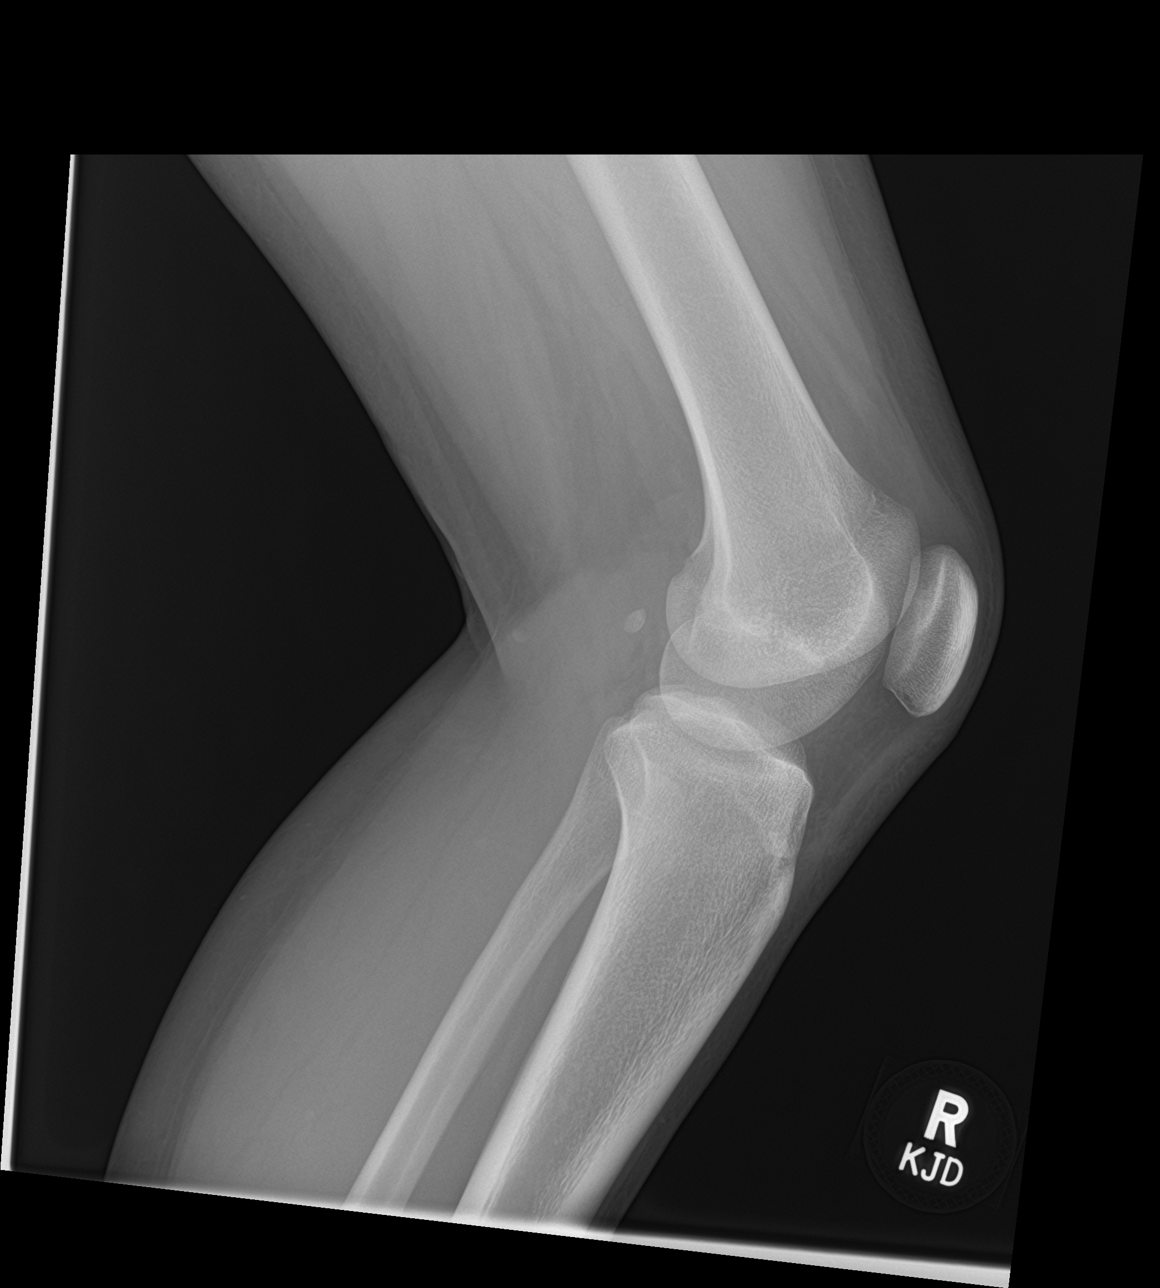

[knee obl (1 of 2)]
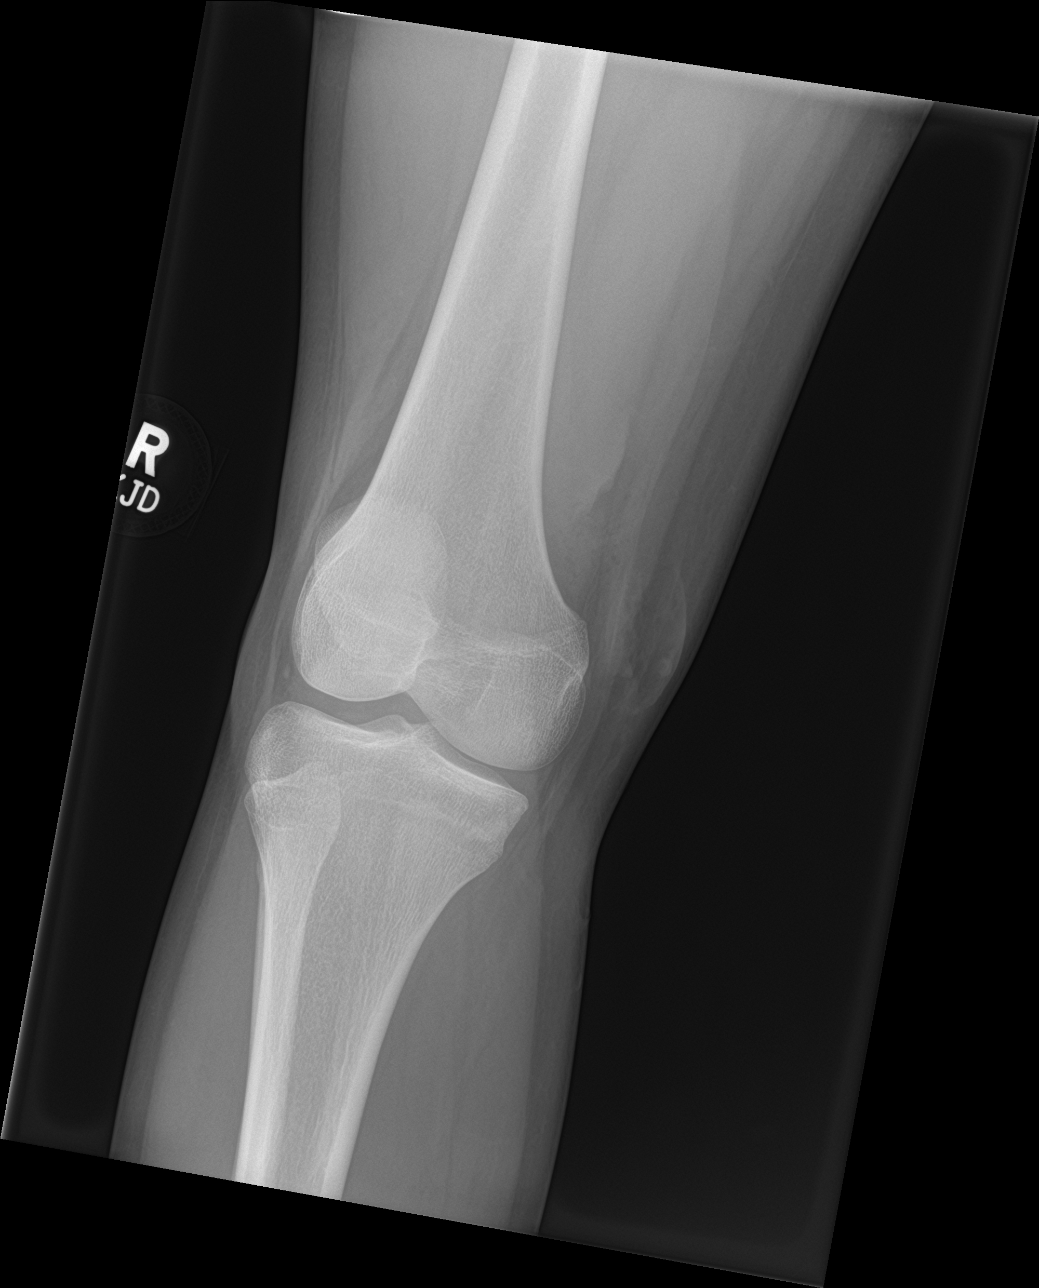

[knee obl (2 of 2)]
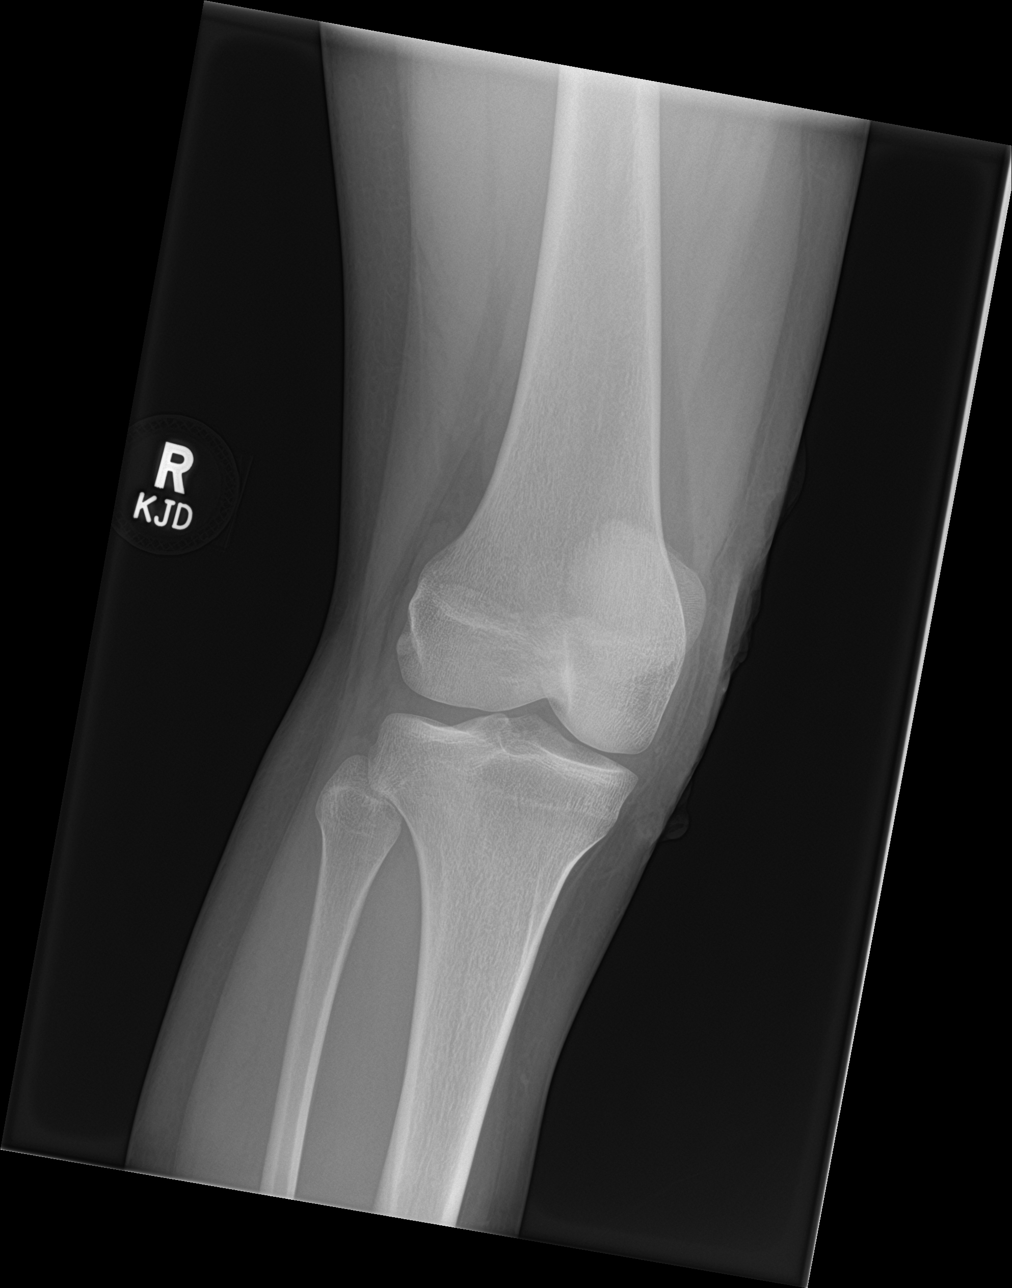

[4 of 4 positions shown; findings below may reference images not displayed]

FINDINGS: There is soft tissue thickening, stranding and a small amount of
subcutaneous gas compatible with laceration and penetrating injury
along the medial aspect of the left knee.

There is a tiny ossific fragment along the medial knee with a small
cortical defect of the medial femoral condyle immediately adjacent
possibly reflecting the donor site. No other acute fracture or
traumatic malalignment.

No visible intra-articular gas to suggest frank joint violation
IMPRESSION: 1. Soft tissue thickening, stranding and subcutaneous gas along the
medial aspect of the left knee compatible with laceration and
penetrating injury.
2. Tiny ossific fragment along the medial knee with a small cortical
defect of the medial femoral condyle possibly reflecting the donor
site.

## 2023-08-04 ENCOUNTER — Encounter (HOSPITAL_COMMUNITY): Payer: Self-pay | Admitting: Obstetrics & Gynecology

## 2023-08-04 ENCOUNTER — Other Ambulatory Visit: Payer: Self-pay

## 2023-08-04 ENCOUNTER — Inpatient Hospital Stay (HOSPITAL_COMMUNITY)
Admission: AD | Admit: 2023-08-04 | Discharge: 2023-08-05 | Disposition: A | Discharge status: Home / Self Care | Attending: Obstetrics & Gynecology | Admitting: Obstetrics & Gynecology

## 2023-08-04 DIAGNOSIS — O471 False labor at or after 37 completed weeks of gestation: Secondary | ICD-10-CM | POA: Insufficient documentation

## 2023-08-04 DIAGNOSIS — O479 False labor, unspecified: Secondary | ICD-10-CM

## 2023-08-04 DIAGNOSIS — Z3A37 37 weeks gestation of pregnancy: Secondary | ICD-10-CM

## 2023-08-04 DIAGNOSIS — Z3A39 39 weeks gestation of pregnancy: Secondary | ICD-10-CM | POA: Insufficient documentation

## 2023-08-04 NOTE — Anesthesia Preprocedure Evaluation (Signed)
 Procedure Information:  Date/Time: 08/05/23 0800   Procedure: CESAREAN DELIVERY ONLY - ARRIVAL:IP   Anesthesia type: General   Diagnosis:      Breech presentation, single or unspecified fetus (HHS-HCC) [O32.1XX0]     [redacted] weeks gestation of pregnancy (HHS-HCC) [Z3A.39]   Pre-op diagnosis:      Breech presentation, single or unspecified fetus (HHS-HCC) [O32.1XX0]     [redacted] weeks gestation of pregnancy (HHS-HCC) [Z3A.39]   Location: [facility] OR 02 [facility] / OR [facility]   Surgeons: Merrianne Jeffory Beulah Lyle, MD     Anesthesia Evaluation     No history of anesthetic complications No family history of anesthetic complications  Airway     Dental      Pulmonary    Cardiovascular  Exercise tolerance: good   Neuro/Psych    GI/Hepatic/Renal     Endo/Other    Abdominal   OB/GYN      HEENT      Additional Comments:  Breech presentation, no version (HHS-HCC)    [redacted] weeks gestation of pregnancy (HHS-HCC)                 OSA Risk Factor Score: 1 PONV Risk Factor Score: 3  Anesthesia Plan  ASA 2   NPO Appropriate? Yes  Anesthetic:  Spinal  Standard lines and monitors.  Type of induction: IV Airway: native airway            OSA Risk Factor Score: 1           PONV Risk Factor Score: 3                 Post Procedure Pain Management:IV analgesics and oral pain medication.    Anesthesia plan and risk discussed with patient; informed consent obtained.     Plan discussed with CRNA.    Relevant Problems  No relevant active problems

## 2023-08-04 NOTE — MAU Note (Signed)
 Courtney [name] is a 23 y.o. at [redacted]w[redacted]d here in MAU reporting: ctx since Tuesday, but weren't severe - today have been 5 minutes apart and more painful. Denies VB or LOF - lost mucous plug. +FM. Scheduled for repeat c/s tomorrow at [facility] for breech presentation.   LMP: NA Pain score: 8 Vitals:   08/04/23 2203  BP: 122/75  Pulse: 83  Resp: 18  Temp: 98.5 F (36.9 C)  SpO2: 100%     FHT: 155  Lab orders placed from triage: LABOR EVAL

## 2023-08-05 DIAGNOSIS — O471 False labor at or after 37 completed weeks of gestation: Secondary | ICD-10-CM | POA: Diagnosis present

## 2023-08-05 DIAGNOSIS — Z3A37 37 weeks gestation of pregnancy: Secondary | ICD-10-CM | POA: Diagnosis not present

## 2023-08-05 DIAGNOSIS — O479 False labor, unspecified: Secondary | ICD-10-CM | POA: Diagnosis not present

## 2023-08-05 DIAGNOSIS — Z3A39 39 weeks gestation of pregnancy: Secondary | ICD-10-CM | POA: Diagnosis not present

## 2023-08-05 NOTE — MAU Provider Note (Signed)
 Ms. Courtney [name] is a G2P1001 at [redacted]w[redacted]d seen in MAU for labor. RN labor check, not seen by provider.   SVE by RN Dilation: 1.5 Effacement (%): 50 Station: Ballotable Presentation: Undeterminable Exam by:: [employer], RN   NST - FHR: 135 bpm / moderate variability / accels present / decels absent NST reactive / TOCO: irregular every 5-10 mins   Plan:  D/C home with labor precautions Keep scheduled appt with [facility] Boards, CNM  08/05/2023 12:16 AM

## 2023-08-05 NOTE — Discharge Instructions (Signed)
 Reasons to return to MAU at [facility]:  1.  Contractions are  5 minutes apart or less, each last 1 minute, these have been going on for 1-2 hours, and you cannot walk or talk during them 2.  You have a large gush of fluid, or a trickle of fluid that will not stop and you have to wear a pad 3.  You have bleeding that is bright red, heavier than spotting--like menstrual bleeding (spotting can be normal in early labor or after a check of your cervix) 4.  You do not feel the baby moving like he/she normally does
